# Patient Record
Sex: Male | Born: 1990 | Race: White | Hispanic: No | Marital: Single | Smoking: Former smoker
Health system: Southern US, Community
[De-identification: ages and names within clinical notes are randomized; demographics above are authoritative.]

## PROBLEM LIST (undated history)

## (undated) DIAGNOSIS — T7840XA Allergy, unspecified, initial encounter: Secondary | ICD-10-CM

## (undated) DIAGNOSIS — F32A Depression, unspecified: Secondary | ICD-10-CM

## (undated) DIAGNOSIS — K219 Gastro-esophageal reflux disease without esophagitis: Secondary | ICD-10-CM

## (undated) DIAGNOSIS — F419 Anxiety disorder, unspecified: Secondary | ICD-10-CM

## (undated) HISTORY — DX: Gastro-esophageal reflux disease without esophagitis: K21.9

## (undated) HISTORY — PX: TONSILLECTOMY: SUR1361

## (undated) HISTORY — DX: Depression, unspecified: F32.A

## (undated) HISTORY — DX: Anxiety disorder, unspecified: F41.9

## (undated) HISTORY — DX: Allergy, unspecified, initial encounter: T78.40XA

---
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
## 2017-11-20 ENCOUNTER — Emergency Department (HOSPITAL_BASED_OUTPATIENT_CLINIC_OR_DEPARTMENT_OTHER)
Admission: EM | Admit: 2017-11-20 | Discharge: 2017-11-20 | Disposition: A | Discharge status: Home / Self Care | Payer: Worker's Compensation | Attending: Emergency Medicine | Admitting: Emergency Medicine

## 2017-11-20 ENCOUNTER — Encounter (HOSPITAL_BASED_OUTPATIENT_CLINIC_OR_DEPARTMENT_OTHER): Payer: Self-pay | Admitting: *Deleted

## 2017-11-20 ENCOUNTER — Other Ambulatory Visit: Payer: Self-pay

## 2017-11-20 ENCOUNTER — Emergency Department (HOSPITAL_BASED_OUTPATIENT_CLINIC_OR_DEPARTMENT_OTHER): Payer: Worker's Compensation

## 2017-11-20 DIAGNOSIS — W208XXA Other cause of strike by thrown, projected or falling object, initial encounter: Secondary | ICD-10-CM | POA: Diagnosis not present

## 2017-11-20 DIAGNOSIS — Y9289 Other specified places as the place of occurrence of the external cause: Secondary | ICD-10-CM | POA: Insufficient documentation

## 2017-11-20 DIAGNOSIS — S99921A Unspecified injury of right foot, initial encounter: Secondary | ICD-10-CM | POA: Diagnosis present

## 2017-11-20 DIAGNOSIS — Y9389 Activity, other specified: Secondary | ICD-10-CM | POA: Diagnosis not present

## 2017-11-20 DIAGNOSIS — S97111A Crushing injury of right great toe, initial encounter: Secondary | ICD-10-CM

## 2017-11-20 DIAGNOSIS — Y990 Civilian activity done for income or pay: Secondary | ICD-10-CM | POA: Diagnosis not present

## 2017-11-20 MED ORDER — IBUPROFEN 800 MG PO TABS: 800.0000 mg | ORAL_TABLET | Freq: Three times a day (TID) | ORAL | 0 refills | Status: DC | PRN

## 2017-11-20 MED ORDER — IBUPROFEN 800 MG PO TABS
800.0000 mg | ORAL_TABLET | Freq: Once | ORAL | Status: AC
  Administered 2017-11-20: 800 mg via ORAL
  Filled 2017-11-20: qty 1

## 2017-11-20 NOTE — Discharge Instructions (Addendum)
You were evaluated in the emergency department for question of your right great toe.  Your x-ray did not show any obvious fracture.  You should apply ice to the area.  Gentle soap and water to keep it clean.  Postop shoe may help limit bending with ambulation.  Ibuprofen 3 times a day with food on your stomach.  Return if any concerns.

## 2017-11-20 NOTE — ED Triage Notes (Signed)
Pt was working and dropped a machine on his Big right toe.

## 2017-11-20 NOTE — ED Provider Notes (Signed)
[facility] EMERGENCY DEPARTMENT Provider Note   CSN: [id number] Arrival date & time: 11/20/17  1619     History   Chief Complaint Chief Complaint  Patient presents with  . Extremity Laceration    HPI Dennis Russo is a 27 y.o. male.  He is here with a work-related injury.  He said he dropped about 100 pound machine onto his right foot crushing his right great toe.  He is wearing sneakers at the time.  This occurred about an hour prior to arrival.  He rates the pain is severe and throbbing in his great toe.  There is been associated bleeding.  Denies other injury.  He is tried nothing for it.  The history is provided by the patient.  Foot Injury   The incident occurred less than 1 hour ago. The incident occurred at work. The injury mechanism was a direct blow. The pain is present in the right toes. The quality of the pain is described as throbbing. The pain is severe. The pain has been constant since onset. Associated symptoms include tingling. Pertinent negatives include no numbness, no inability to bear weight, no loss of motion and no loss of sensation. He reports no foreign bodies present. The symptoms are aggravated by bearing weight and palpation. He has tried nothing for the symptoms. The treatment provided no relief.    History reviewed. No pertinent past medical history.  There are no active problems to display for this patient.   Past Surgical History:  Procedure Laterality Date  . TONSILLECTOMY          Home Medications    Prior to Admission medications   Medication Sig Start Date End Date Taking? Authorizing Provider  ibuprofen (ADVIL,MOTRIN) 800 MG tablet Take 1 tablet (800 mg total) by mouth every 8 (eight) hours as needed. 11/20/17   Terrilee Files, MD    Family History History reviewed. No pertinent family history.  Social History Social History   Tobacco Use  . Smoking status: Never Smoker  . Smokeless tobacco: Never Used    Substance Use Topics  . Alcohol use: Yes    Alcohol/week: 2.0 standard drinks    Types: 2 Cans of beer per week  . Drug use: Never     Allergies   Penicillins   Review of Systems Review of Systems  Constitutional: Negative for fever.  HENT: Negative for sore throat.   Respiratory: Negative for shortness of breath.   Cardiovascular: Negative for chest pain.  Gastrointestinal: Negative for abdominal pain.  Genitourinary: Negative for dysuria.  Musculoskeletal: Negative for back pain.  Skin: Positive for wound. Negative for rash.  Neurological: Positive for tingling. Negative for numbness.     Physical Exam Updated Vital Signs BP (!) 145/93 (BP Location: Right Arm)   Pulse 77   Temp 98.4 F (36.9 C) (Oral)   Resp 18   Ht 6\' 3"  (1.905 m)   Wt 108.9 kg   SpO2 97%   BMI 30.00 kg/m   Physical Exam  Constitutional: He appears well-developed and well-nourished.  HENT:  Head: Normocephalic and atraumatic.  Eyes: Conjunctivae are normal.  Neck: Neck supple.  Pulmonary/Chest: Effort normal.  Musculoskeletal: He exhibits tenderness. He exhibits no deformity.  Diffuse tenderness over his right great toe from his MTP to his distal phalanx.  There is some blood from underneath the nail and a small subungual hematoma.  Nail is intact.  Sensation intact to light touch.  Other digits unaffected no tenderness  of the mid or proximal foot or ankle.  Neurological: He is alert. GCS eye subscore is 4. GCS verbal subscore is 5. GCS motor subscore is 6.  Skin: Skin is warm and dry.  Psychiatric: He has a normal mood and affect.  Nursing note and vitals reviewed.    ED Treatments / Results  Labs (all labs ordered are listed, but only abnormal results are displayed) Labs Reviewed - No data to display  EKG None  Radiology Dg Toe Great Right  Result Date: 11/20/2017 CLINICAL DATA:  27 y/o M; crush injury of great toe with blood oozing from the toenail. EXAM: RIGHT GREAT TOE  COMPARISON:  None. FINDINGS: There is no evidence of fracture or dislocation. There is no evidence of arthropathy or other focal bone abnormality. S IMPRESSION: No acute fracture or dislocation identified. Electronically Signed   By: Mitzi HansenLance  Furusawa-Stratton M.D.   On: 11/20/2017 17:14    Procedures Procedures (including critical care time)  Medications Ordered in ED Medications  ibuprofen (ADVIL,MOTRIN) tablet 800 mg (has no administration in time range)     Initial Impression / Assessment and Plan / ED Course  I have reviewed the triage vital signs and the nursing notes.  Pertinent labs & imaging results that were available during my care of the patient were reviewed by me and considered in my medical decision making (see chart for details).       Final Clinical Impressions(s) / ED Diagnoses   Final diagnoses:  Crushing injury of right great toe, initial encounter    ED Discharge Orders         Ordered    ibuprofen (ADVIL,MOTRIN) 800 MG tablet  Every 8 hours PRN     11/20/17 1727           Terrilee FilesButler, Trace Cederberg C, MD 11/21/17 1505

## 2019-09-27 IMAGING — DX DG TOE GREAT 2+V*R*
3 series · 3 of 3 positions shown · non-contrast
Comparison: None.

CLINICAL DATA: 27 y/o M; crush injury of great toe with blood
oozing from the toenail.

EXAM:
RIGHT GREAT TOE

[toe ap]
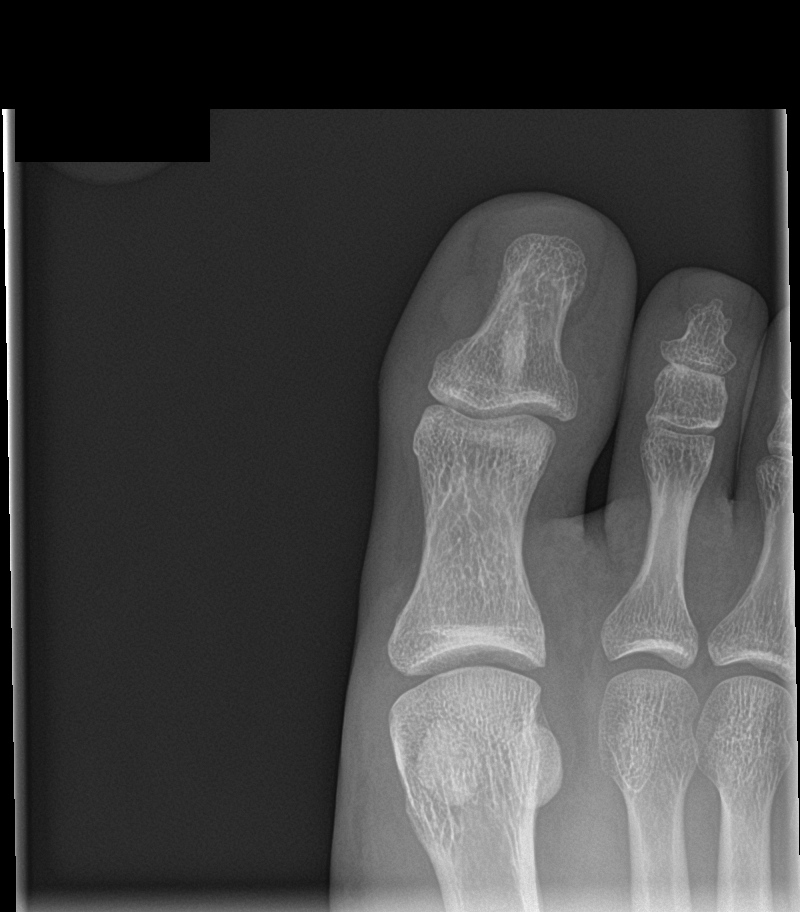

[toe obl]
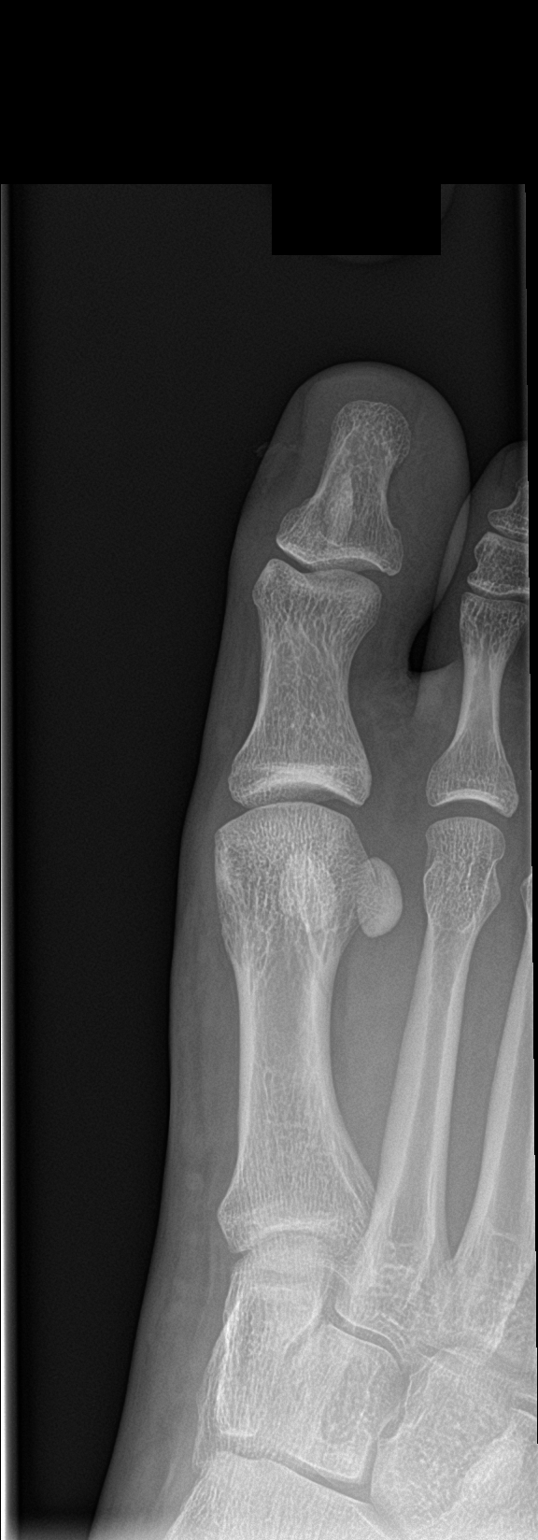

[toe lat]
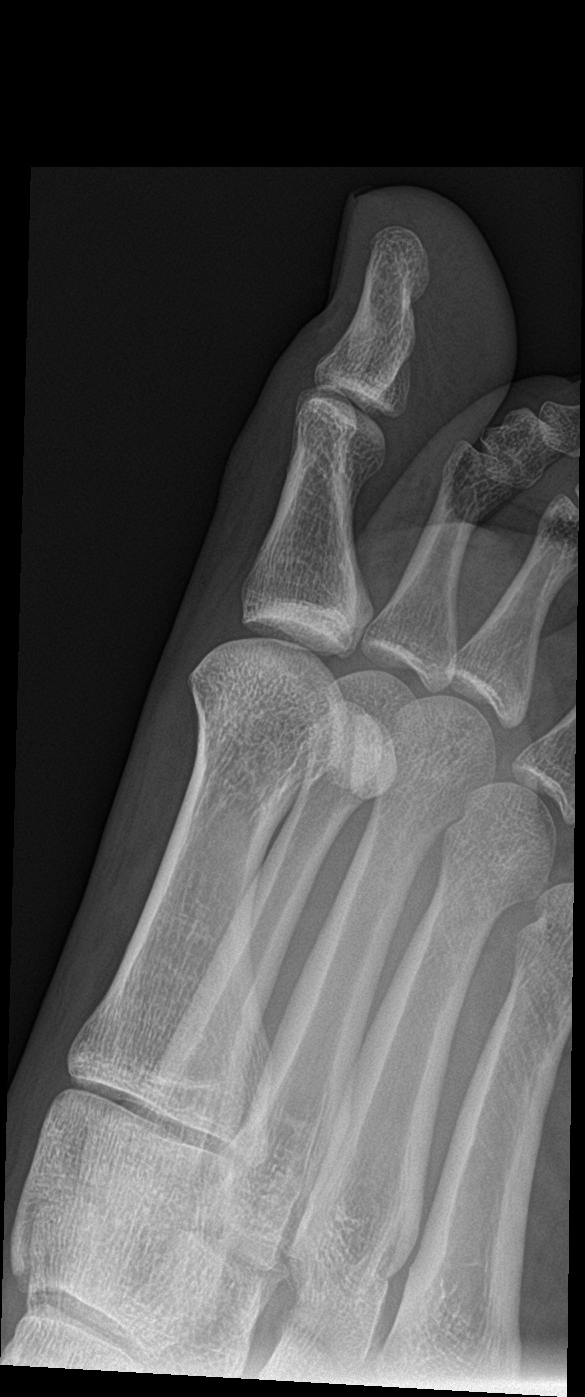

[3 of 3 positions shown; findings below may reference images not displayed]

FINDINGS: There is no evidence of fracture or dislocation. There is no
evidence of arthropathy or other focal bone abnormality. S
IMPRESSION: No acute fracture or dislocation identified.

## 2022-06-05 DIAGNOSIS — F331 Major depressive disorder, recurrent, moderate: Secondary | ICD-10-CM | POA: Diagnosis not present

## 2022-06-05 DIAGNOSIS — F3489 Other specified persistent mood disorders: Secondary | ICD-10-CM | POA: Diagnosis not present

## 2022-06-05 DIAGNOSIS — F411 Generalized anxiety disorder: Secondary | ICD-10-CM | POA: Diagnosis not present

## 2022-06-06 DIAGNOSIS — F331 Major depressive disorder, recurrent, moderate: Secondary | ICD-10-CM | POA: Diagnosis not present

## 2022-06-06 DIAGNOSIS — F411 Generalized anxiety disorder: Secondary | ICD-10-CM | POA: Diagnosis not present

## 2022-06-10 DIAGNOSIS — F411 Generalized anxiety disorder: Secondary | ICD-10-CM | POA: Diagnosis not present

## 2022-06-10 DIAGNOSIS — F3489 Other specified persistent mood disorders: Secondary | ICD-10-CM | POA: Diagnosis not present

## 2022-06-10 DIAGNOSIS — F331 Major depressive disorder, recurrent, moderate: Secondary | ICD-10-CM | POA: Diagnosis not present

## 2022-06-12 DIAGNOSIS — F411 Generalized anxiety disorder: Secondary | ICD-10-CM | POA: Diagnosis not present

## 2022-06-12 DIAGNOSIS — F331 Major depressive disorder, recurrent, moderate: Secondary | ICD-10-CM | POA: Diagnosis not present

## 2022-06-12 DIAGNOSIS — Z79899 Other long term (current) drug therapy: Secondary | ICD-10-CM | POA: Diagnosis not present

## 2022-06-12 DIAGNOSIS — F3489 Other specified persistent mood disorders: Secondary | ICD-10-CM | POA: Diagnosis not present

## 2022-06-21 DIAGNOSIS — F331 Major depressive disorder, recurrent, moderate: Secondary | ICD-10-CM | POA: Diagnosis not present

## 2022-06-21 DIAGNOSIS — F3489 Other specified persistent mood disorders: Secondary | ICD-10-CM | POA: Diagnosis not present

## 2022-06-21 DIAGNOSIS — F411 Generalized anxiety disorder: Secondary | ICD-10-CM | POA: Diagnosis not present

## 2022-06-25 DIAGNOSIS — Z79899 Other long term (current) drug therapy: Secondary | ICD-10-CM | POA: Diagnosis not present

## 2022-06-25 DIAGNOSIS — F411 Generalized anxiety disorder: Secondary | ICD-10-CM | POA: Diagnosis not present

## 2022-06-25 DIAGNOSIS — F3489 Other specified persistent mood disorders: Secondary | ICD-10-CM | POA: Diagnosis not present

## 2022-06-25 DIAGNOSIS — F331 Major depressive disorder, recurrent, moderate: Secondary | ICD-10-CM | POA: Diagnosis not present

## 2022-07-18 DIAGNOSIS — F331 Major depressive disorder, recurrent, moderate: Secondary | ICD-10-CM | POA: Diagnosis not present

## 2022-07-18 DIAGNOSIS — F411 Generalized anxiety disorder: Secondary | ICD-10-CM | POA: Diagnosis not present

## 2022-07-18 DIAGNOSIS — Z79899 Other long term (current) drug therapy: Secondary | ICD-10-CM | POA: Diagnosis not present

## 2022-07-26 DIAGNOSIS — F411 Generalized anxiety disorder: Secondary | ICD-10-CM | POA: Diagnosis not present

## 2022-07-26 DIAGNOSIS — F331 Major depressive disorder, recurrent, moderate: Secondary | ICD-10-CM | POA: Diagnosis not present

## 2022-08-22 DIAGNOSIS — F411 Generalized anxiety disorder: Secondary | ICD-10-CM | POA: Diagnosis not present

## 2022-08-22 DIAGNOSIS — F331 Major depressive disorder, recurrent, moderate: Secondary | ICD-10-CM | POA: Diagnosis not present

## 2022-08-26 DIAGNOSIS — Z79899 Other long term (current) drug therapy: Secondary | ICD-10-CM | POA: Diagnosis not present

## 2022-08-26 DIAGNOSIS — F411 Generalized anxiety disorder: Secondary | ICD-10-CM | POA: Diagnosis not present

## 2022-08-26 DIAGNOSIS — F331 Major depressive disorder, recurrent, moderate: Secondary | ICD-10-CM | POA: Diagnosis not present

## 2022-09-09 DIAGNOSIS — F411 Generalized anxiety disorder: Secondary | ICD-10-CM | POA: Diagnosis not present

## 2022-09-09 DIAGNOSIS — F331 Major depressive disorder, recurrent, moderate: Secondary | ICD-10-CM | POA: Diagnosis not present

## 2022-09-09 DIAGNOSIS — Z79899 Other long term (current) drug therapy: Secondary | ICD-10-CM | POA: Diagnosis not present

## 2022-09-22 DIAGNOSIS — Z79899 Other long term (current) drug therapy: Secondary | ICD-10-CM | POA: Diagnosis not present

## 2022-09-22 DIAGNOSIS — F411 Generalized anxiety disorder: Secondary | ICD-10-CM | POA: Diagnosis not present

## 2022-09-22 DIAGNOSIS — F331 Major depressive disorder, recurrent, moderate: Secondary | ICD-10-CM | POA: Diagnosis not present

## 2022-09-23 DIAGNOSIS — F331 Major depressive disorder, recurrent, moderate: Secondary | ICD-10-CM | POA: Diagnosis not present

## 2022-09-23 DIAGNOSIS — F411 Generalized anxiety disorder: Secondary | ICD-10-CM | POA: Diagnosis not present

## 2022-10-02 DIAGNOSIS — F411 Generalized anxiety disorder: Secondary | ICD-10-CM | POA: Diagnosis not present

## 2022-10-02 DIAGNOSIS — Z79899 Other long term (current) drug therapy: Secondary | ICD-10-CM | POA: Diagnosis not present

## 2022-10-02 DIAGNOSIS — F331 Major depressive disorder, recurrent, moderate: Secondary | ICD-10-CM | POA: Diagnosis not present

## 2022-10-22 DIAGNOSIS — F411 Generalized anxiety disorder: Secondary | ICD-10-CM | POA: Diagnosis not present

## 2022-10-22 DIAGNOSIS — F331 Major depressive disorder, recurrent, moderate: Secondary | ICD-10-CM | POA: Diagnosis not present

## 2022-10-24 DIAGNOSIS — F331 Major depressive disorder, recurrent, moderate: Secondary | ICD-10-CM | POA: Diagnosis not present

## 2022-10-24 DIAGNOSIS — Z79899 Other long term (current) drug therapy: Secondary | ICD-10-CM | POA: Diagnosis not present

## 2022-10-24 DIAGNOSIS — F411 Generalized anxiety disorder: Secondary | ICD-10-CM | POA: Diagnosis not present

## 2022-11-04 DIAGNOSIS — F331 Major depressive disorder, recurrent, moderate: Secondary | ICD-10-CM | POA: Diagnosis not present

## 2022-11-04 DIAGNOSIS — F411 Generalized anxiety disorder: Secondary | ICD-10-CM | POA: Diagnosis not present

## 2022-11-04 DIAGNOSIS — Z79899 Other long term (current) drug therapy: Secondary | ICD-10-CM | POA: Diagnosis not present

## 2022-11-21 DIAGNOSIS — F331 Major depressive disorder, recurrent, moderate: Secondary | ICD-10-CM | POA: Diagnosis not present

## 2022-11-21 DIAGNOSIS — F411 Generalized anxiety disorder: Secondary | ICD-10-CM | POA: Diagnosis not present

## 2022-12-19 DIAGNOSIS — F411 Generalized anxiety disorder: Secondary | ICD-10-CM | POA: Diagnosis not present

## 2022-12-19 DIAGNOSIS — F331 Major depressive disorder, recurrent, moderate: Secondary | ICD-10-CM | POA: Diagnosis not present

## 2022-12-23 DIAGNOSIS — F411 Generalized anxiety disorder: Secondary | ICD-10-CM | POA: Diagnosis not present

## 2022-12-23 DIAGNOSIS — Z79899 Other long term (current) drug therapy: Secondary | ICD-10-CM | POA: Diagnosis not present

## 2022-12-23 DIAGNOSIS — F331 Major depressive disorder, recurrent, moderate: Secondary | ICD-10-CM | POA: Diagnosis not present

## 2023-01-21 DIAGNOSIS — Z79899 Other long term (current) drug therapy: Secondary | ICD-10-CM | POA: Diagnosis not present

## 2023-01-21 DIAGNOSIS — F331 Major depressive disorder, recurrent, moderate: Secondary | ICD-10-CM | POA: Diagnosis not present

## 2023-01-21 DIAGNOSIS — F411 Generalized anxiety disorder: Secondary | ICD-10-CM | POA: Diagnosis not present

## 2023-01-23 DIAGNOSIS — F331 Major depressive disorder, recurrent, moderate: Secondary | ICD-10-CM | POA: Diagnosis not present

## 2023-01-23 DIAGNOSIS — F411 Generalized anxiety disorder: Secondary | ICD-10-CM | POA: Diagnosis not present

## 2023-02-27 DIAGNOSIS — F411 Generalized anxiety disorder: Secondary | ICD-10-CM | POA: Diagnosis not present

## 2023-02-27 DIAGNOSIS — F331 Major depressive disorder, recurrent, moderate: Secondary | ICD-10-CM | POA: Diagnosis not present

## 2023-03-27 DIAGNOSIS — F331 Major depressive disorder, recurrent, moderate: Secondary | ICD-10-CM | POA: Diagnosis not present

## 2023-03-27 DIAGNOSIS — F411 Generalized anxiety disorder: Secondary | ICD-10-CM | POA: Diagnosis not present

## 2023-04-02 DIAGNOSIS — F331 Major depressive disorder, recurrent, moderate: Secondary | ICD-10-CM | POA: Diagnosis not present

## 2023-04-02 DIAGNOSIS — Z79899 Other long term (current) drug therapy: Secondary | ICD-10-CM | POA: Diagnosis not present

## 2023-04-02 DIAGNOSIS — F411 Generalized anxiety disorder: Secondary | ICD-10-CM | POA: Diagnosis not present

## 2023-05-21 DIAGNOSIS — F331 Major depressive disorder, recurrent, moderate: Secondary | ICD-10-CM | POA: Diagnosis not present

## 2023-05-21 DIAGNOSIS — F411 Generalized anxiety disorder: Secondary | ICD-10-CM | POA: Diagnosis not present

## 2023-06-11 DIAGNOSIS — F331 Major depressive disorder, recurrent, moderate: Secondary | ICD-10-CM | POA: Diagnosis not present

## 2023-06-11 DIAGNOSIS — F411 Generalized anxiety disorder: Secondary | ICD-10-CM | POA: Diagnosis not present

## 2023-06-11 DIAGNOSIS — Z79899 Other long term (current) drug therapy: Secondary | ICD-10-CM | POA: Diagnosis not present

## 2023-06-19 DIAGNOSIS — F411 Generalized anxiety disorder: Secondary | ICD-10-CM | POA: Diagnosis not present

## 2023-06-19 DIAGNOSIS — F331 Major depressive disorder, recurrent, moderate: Secondary | ICD-10-CM | POA: Diagnosis not present

## 2023-06-29 DIAGNOSIS — F331 Major depressive disorder, recurrent, moderate: Secondary | ICD-10-CM | POA: Diagnosis not present

## 2023-06-29 DIAGNOSIS — F411 Generalized anxiety disorder: Secondary | ICD-10-CM | POA: Diagnosis not present

## 2023-06-29 DIAGNOSIS — Z79899 Other long term (current) drug therapy: Secondary | ICD-10-CM | POA: Diagnosis not present

## 2023-07-17 DIAGNOSIS — F331 Major depressive disorder, recurrent, moderate: Secondary | ICD-10-CM | POA: Diagnosis not present

## 2023-07-17 DIAGNOSIS — F411 Generalized anxiety disorder: Secondary | ICD-10-CM | POA: Diagnosis not present

## 2023-08-25 DIAGNOSIS — F411 Generalized anxiety disorder: Secondary | ICD-10-CM | POA: Diagnosis not present

## 2023-08-25 DIAGNOSIS — F331 Major depressive disorder, recurrent, moderate: Secondary | ICD-10-CM | POA: Diagnosis not present

## 2023-08-25 DIAGNOSIS — Z79899 Other long term (current) drug therapy: Secondary | ICD-10-CM | POA: Diagnosis not present

## 2023-08-28 DIAGNOSIS — F331 Major depressive disorder, recurrent, moderate: Secondary | ICD-10-CM | POA: Diagnosis not present

## 2023-08-28 DIAGNOSIS — F411 Generalized anxiety disorder: Secondary | ICD-10-CM | POA: Diagnosis not present

## 2023-10-20 ENCOUNTER — Ambulatory Visit: Admitting: Student in an Organized Health Care Education/Training Program

## 2023-10-20 ENCOUNTER — Encounter: Payer: Self-pay | Admitting: Student in an Organized Health Care Education/Training Program

## 2023-10-20 VITALS — BP 114/78 | HR 75 | Ht 74.0 in | Wt 317.8 lb

## 2023-10-20 DIAGNOSIS — Z800 Family history of malignant neoplasm of digestive organs: Secondary | ICD-10-CM

## 2023-10-20 DIAGNOSIS — Z131 Encounter for screening for diabetes mellitus: Secondary | ICD-10-CM | POA: Diagnosis not present

## 2023-10-20 DIAGNOSIS — Z0000 Encounter for general adult medical examination without abnormal findings: Secondary | ICD-10-CM | POA: Diagnosis not present

## 2023-10-20 DIAGNOSIS — Z1159 Encounter for screening for other viral diseases: Secondary | ICD-10-CM | POA: Diagnosis not present

## 2023-10-20 DIAGNOSIS — Z114 Encounter for screening for human immunodeficiency virus [HIV]: Secondary | ICD-10-CM | POA: Diagnosis not present

## 2023-10-20 DIAGNOSIS — E66813 Obesity, class 3: Secondary | ICD-10-CM

## 2023-10-20 DIAGNOSIS — Z23 Encounter for immunization: Secondary | ICD-10-CM | POA: Diagnosis not present

## 2023-10-20 DIAGNOSIS — F39 Unspecified mood [affective] disorder: Secondary | ICD-10-CM

## 2023-10-20 DIAGNOSIS — Z13220 Encounter for screening for lipoid disorders: Secondary | ICD-10-CM | POA: Diagnosis not present

## 2023-10-20 DIAGNOSIS — K219 Gastro-esophageal reflux disease without esophagitis: Secondary | ICD-10-CM

## 2023-10-20 DIAGNOSIS — L720 Epidermal cyst: Secondary | ICD-10-CM | POA: Insufficient documentation

## 2023-10-20 MED ORDER — BUPROPION HCL ER (SR) 100 MG PO TB12: 100.0000 mg | ORAL_TABLET | Freq: Every day | ORAL | Status: AC

## 2023-10-20 NOTE — Progress Notes (Addendum)
 Complete physical exam  Patient: Dennis Russo   DOB: June 22, 1990   33 y.o. Male  MRN: [id number]  Subjective:    Chief Complaint  Patient presents with   Annual Exam   Establish Care   Cyst    Right shoulder. Has been there for 10 years. Just wants it looked at.     Dennis Russo is a 33 y.o. male who presents today for a complete physical exam. He reports consuming a general diet. The patient does not participate in regular exercise at present. He generally feels well. He reports sleeping well. He does have additional problems to discuss today.   Discussed the use of AI scribe software for clinical note transcription with the patient, who gave verbal consent to proceed.  History of Present Illness Dennis Russo is a 33 year old male who presents for a new patient visit and weight management.  He acknowledges being overweight and wants to manage his weight. Earlier in the year, he was actively managing his weight but faced setbacks due to family stressors, including the passing of his grandfather and his mother's cancer diagnosis. Initially, he weighed around 310-312 pounds, reduced to 290 pounds, but has since increased to 317 pounds. He attributes this to stress eating and a lack of exercise. Previously, he followed a keto diet and reduced portion sizes and soda intake, which helped him lose weight. Currently, he estimates his daily caloric intake to be around 2500-3000 calories, whereas he aims for 1800-2000 calories.  He experiences occasional lightheadedness at work, which is infrequent and not concerning. No significant breathing problems or chest pain, although he notes difficulty catching his breath after laughing hard. He does not exercise regularly, citing laziness as a barrier, but plans to create an exercise room when he buys a house.  He has a cyst on his right shoulder, present for about a decade, which does not cause pain or significant discomfort. He noticed a  small dot on it recently, possibly from a cat scratch. A previous primary care doctor in [place]  told him it was a cyst and that he did not need to worry about it unless it changed.  He has a history of depression and anxiety, with depression being more prominent. He started seeing a psychiatrist and therapist last year and is currently on Lexapro 30 mg daily and bupropion 100 mg once daily in the morning. He also uses trazodone as needed for sleep issues, particularly on Sundays when he struggles to fall asleep. He describes his depression as a 'blanket or a weight' and his anxiety as situational, such as ensuring punctuality for appointments.  He has a family history of colon cancer; his father died in his early forties from the disease. Due to this, he began colonoscopy screenings early and had his last colonoscopy and endoscopy five years ago. He also has GERD, which he discovered after experiencing throat irritation and acid reflux. He does not currently take medication for GERD as it does not frequently flare up.  He reports no significant issues with alcohol, tobacco, or drug use. He drinks socially, infrequently, and last used tobacco in college. He experimented with drugs in college but has not used marijuana since 2021 or other substances since then.  He wears glasses for vision correction and occasionally experiences earwax buildup, which he manages with ear cleaners. He snores occasionally but generally feels rested after sleep, although he is not a morning person. He typically sleeps from 10 or 11  PM to 7 AM.   Most recent fall risk assessment:    10/20/2023    2:44 PM  Fall Risk   Falls in the past year? 0  Number falls in past yr: 0  Injury with Fall? 0  Risk for fall due to : No Fall Risks  Follow up Falls evaluation completed     Most recent depression screenings:    10/20/2023    2:44 PM  PHQ 2/9 Scores  PHQ - 2 Score 3  PHQ- 9 Score 9    Past Medical History:   Diagnosis Date   Allergy 96698006   Penicillin when i was a baby gave me a rash   Anxiety    Ive had anxiety diagnosed since elementary school   Depression    Ive had clinical depression diagnosed since elementary school   GERD (gastroesophageal reflux disease)    I suffer from gerd noted by an endoscopy in 2020      Patient Care Team: Jerrell Cleatus Ned, MD as PCP - General (Internal Medicine)   Outpatient Medications Prior to Visit  Medication Sig   escitalopram (LEXAPRO) 20 MG tablet Take 30 mg by mouth daily.   traZODone (DESYREL) 50 MG tablet Take 50 mg by mouth at bedtime. (Patient taking differently: Take 50 mg by mouth at bedtime as needed for sleep.)   [DISCONTINUED] buPROPion ER (WELLBUTRIN SR) 100 MG 12 hr tablet Take 100 mg by mouth 2 (two) times daily.   [DISCONTINUED] ibuprofen  (ADVIL ,MOTRIN ) 800 MG tablet Take 1 tablet (800 mg total) by mouth every 8 (eight) hours as needed. (Patient not taking: Reported on 10/20/2023)   No facility-administered medications prior to visit.       Objective:     BP 114/78 (BP Location: Right Arm, Patient Position: Sitting, Cuff Size: Large)   Pulse 75   Ht 6' 2 (1.88 m)   Wt (!) 317 lb 12.8 oz (144.2 kg)   SpO2 99%   BMI 40.80 kg/m   Physical Exam   Gen: Well-appearing man Eyes: Normal Ears: Normal tympanic membranes, minimal wax Neck: Increased circumference, normal thyroid, no nodules or adenopathy Heart: Regular, no murmur Lungs: Unlabored, clear throughout Abd: Soft, obese, striae present, nontender Ext: Warm, no edema, normal joints Skin: 4 cm inclusion cyst with a small erosion on the right posterior shoulder, nonerythematous, nondraining, fluctuant but nontender Neuro: Alert, conversational, full strength upper and lower extremities, normal get up and go, normal gait and balance Psych: Appropriate mood and affect, not anxious or depressed appearing      Assessment & Plan:    Routine Health Maintenance  and Physical Exam  Immunization History  Administered Date(s) Administered   DTaP 06/09/1991, 08/05/1991, 11/02/1991, 11/02/1992, 08/04/1996   HIB (PRP-OMP) 06/09/1991, 08/05/1991, 11/02/1991, 11/02/1992   Hepatitis B, PED/ADOLESCENT 18-Sep-1990, 05/12/1991, 02/10/1992   IPV 06/09/1991, 08/05/1991, 11/02/1992, 08/04/1996   Influenza, Seasonal, Injecte, Preservative Fre 10/20/2023   Janssen (J&J) SARS-COV-2 Vaccination 04/16/2019   MMR 06/28/1992, 08/04/1996   MenQuadfi_Meningococcal Groups ACYW Conjugate 07/10/2009   Td 08/20/2004   Tdap 10/20/2023   Unspecified SARS-COV-2 Vaccination 12/10/2019    Health Maintenance  Topic Date Due   HPV VACCINES (1 - 3-dose SCDM series) Never done   COVID-19 Vaccine (3 - 2025-26 season) 09/07/2023   DTaP/Tdap/Td (8 - Td or Tdap) 10/19/2033   Influenza Vaccine  Completed   Hepatitis B Vaccines 19-59 Average Risk  Completed   Hepatitis C Screening  Completed   HIV Screening  Completed  Pneumococcal Vaccine  Aged Out   Meningococcal B Vaccine  Aged Out    Discussed health benefits of physical activity, and encouraged him to engage in regular exercise appropriate for his age and condition.  Problem List Items Addressed This Visit       High   Mood disorder (Chronic)   His depression and anxiety are managed with Lexapro, bupropion, and trazodone. The current regimen is effective under his psychiatrist's management. Continue current psychiatric medications as directed by his psychiatrist.      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 Discussed medication-assisted weight loss options such as Zepbound and Mounjaro, which can achieve 15-20% weight loss over 6-12 months. Ordered blood work to assess diabetes and weight-related health issues.  I recommended continued lifestyle modifications, I recommended a 500 -calorie/day  deficits, for him would be around 2500 cal/day.      Relevant Orders   Comprehensive metabolic panel with GFR (Completed)   TSH (Completed)     Medium    GERD (gastroesophageal reflux disease) (Chronic)   His GERD is managed without medication. Discussed the potential use of omeprazole for flare-ups. Advise reporting any GERD flare-ups for possible omeprazole treatment.        Low   Health maintenance examination - Primary (Chronic)   Overall doing fairly well.  We talked about diet and exercise interventions for obesity and to lower the risk of future metabolic syndrome.  We worked on mood disorder.  No issues with substance use.  We talked about his family history of colon cancer which puts him at an above average risk of cancer, he has a good plan for screening colonoscopies every 5 years that is appropriate.  Vaccinations up-to-date, flu vaccine received today.      Family history of colon cancer in father (Chronic)   Father was diagnosed with colon cancer around the age of 58, ultimately passed away from this.  The patient started colon cancer screening already around the age of 41.  He is due for a 5-year follow-up colonoscopy, already established with GI.      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 It poses no cancer risk but is bothersome. Surgical removal is required. Refer to general surgery for evaluation of cyst removal.  Not currently infected, no need for antibiotics.  Not currently draining either.  Given its size, does seem reasonable that he wants this removed.      Relevant Orders   Ambulatory referral to General Surgery   Other Visit Diagnoses       Need for influenza vaccination       Relevant Orders   Flu vaccine trivalent PF, 6mos and older(Flulaval,Afluria,Fluarix,Fluzone) (Completed)     Screening for HIV (human immunodeficiency virus)       Relevant Orders   HIV  antibody (with reflex) (Completed)     Encounter for HCV screening test for low risk patient       Relevant Orders   Hepatitis C Antibody (Completed)     Screening for diabetes mellitus       Relevant Orders   Hemoglobin A1c (Completed)     Screening for lipid disorders       Relevant Orders   Lipid panel (Completed)  Need for diphtheria-tetanus-pertussis (Tdap) vaccine       Relevant Orders   Tdap vaccine greater than or equal to 7yo IM (Completed)      Return in about 1 year (around 10/19/2024).     Cleatus Debby Specking, MD

## 2023-10-20 NOTE — Assessment & Plan Note (Signed)
 He has a 4 cm epidermal inclusion cyst on his right shoulder, present for a decade. It poses no cancer risk but is bothersome. Surgical removal is required. Refer to general surgery for evaluation of cyst removal.  Not currently infected, no need for antibiotics.  Not currently draining either.  Given its size, does seem reasonable that he wants this removed.

## 2023-10-20 NOTE — Assessment & Plan Note (Signed)
 His GERD is managed without medication. Discussed the potential use of omeprazole for flare-ups. Advise reporting any GERD flare-ups for possible omeprazole treatment.

## 2023-10-20 NOTE — Assessment & Plan Note (Signed)
 Father was diagnosed with colon cancer around the age of 29, ultimately passed away from this.  The patient started colon cancer screening already around the age of 44.  He is due for a 5-year follow-up colonoscopy, already established with GI.

## 2023-10-20 NOTE — Assessment & Plan Note (Signed)
 His depression and anxiety are managed with Lexapro, bupropion, and trazodone. The current regimen is effective under his psychiatrist's management. Continue current psychiatric medications as directed by his psychiatrist.

## 2023-10-20 NOTE — Patient Instructions (Signed)
  VISIT SUMMARY: Today, you came in for a new patient visit and to discuss weight management. We talked about your recent weight changes, stress factors, and your current health concerns, including your cyst, depression, anxiety, and GERD. We also reviewed your general health and wellness.  YOUR PLAN: -OBESITY: Obesity means having an excessive amount of body fat, which can lead to various health issues. We discussed medication options like Zepbound and Mounjaro that can help you lose 15-20% of your weight over 6-12 months. We will also do blood work to check for diabetes and other weight-related health issues. Please check with your insurance about coverage for these medications.  -DEPRESSION AND ANXIETY: Depression and anxiety are mental health conditions that affect your mood and behavior. Your current medications (Lexapro, bupropion, and trazodone) are working well, so continue taking them as prescribed by your psychiatrist.  -EPIDERMAL INCLUSION CYST, RIGHT SHOULDER: An epidermal inclusion cyst is a non-cancerous lump under the skin. You have a 4 cm cyst on your right shoulder that has been there for a decade. We will refer you to general surgery to evaluate and possibly remove the cyst.  -GASTROESOPHAGEAL REFLUX DISEASE (GERD): GERD is a condition where stomach acid frequently flows back into the tube connecting your mouth and stomach, causing irritation. You manage your GERD without medication, but if you have flare-ups, let us  know so we can consider prescribing omeprazole.  -ADULT WELLNESS VISIT: During your wellness visit, we discussed your general health, diet, exercise, and mental health. We will perform a physical examination and order blood work to check your kidney function, liver enzymes, cholesterol, blood sugars, and thyroid function.  INSTRUCTIONS: Please follow up with the recommended blood work to assess your overall health and check for diabetes and other weight-related issues.  Also, check with your insurance about coverage for weight loss medications. We will refer you to general surgery for the evaluation and possible removal of the cyst on your right shoulder. Continue taking your psychiatric medications as prescribed by your psychiatrist. If you experience any GERD flare-ups, please report them so we can consider treatment options.

## 2023-10-20 NOTE — Assessment & Plan Note (Signed)
 He has a BMI of 40, indicating obesity. Discussed medication-assisted weight loss options such as Zepbound and Mounjaro, which can achieve 15-20% weight loss over 6-12 months. Ordered blood work to assess diabetes and weight-related health issues.  I recommended continued lifestyle modifications, I recommended a 500 -calorie/day deficits, for him would be around 2500 cal/day.

## 2023-10-21 LAB — COMPREHENSIVE METABOLIC PANEL WITH GFR
ALT: 32 U/L (ref 0–53)
AST: 20 U/L (ref 0–37)
Albumin: 4.8 g/dL (ref 3.5–5.2)
Alkaline Phosphatase: 99 U/L (ref 39–117)
BUN: 11 mg/dL (ref 6–23)
CO2: 24 meq/L (ref 19–32)
Calcium: 9.5 mg/dL (ref 8.4–10.5)
Chloride: 100 meq/L (ref 96–112)
Creatinine, Ser: 1.04 mg/dL (ref 0.40–1.50)
GFR: 94.98 mL/min (ref >60.00)
Glucose, Bld: 68 mg/dL — ABNORMAL LOW (ref 70–99)
Potassium: 3.6 meq/L (ref 3.5–5.1)
Sodium: 137 meq/L (ref 135–145)
Total Bilirubin: 0.5 mg/dL (ref 0.2–1.2)
Total Protein: 8.2 g/dL (ref 6.0–8.3)

## 2023-10-21 LAB — LIPID PANEL
Cholesterol: 221 mg/dL — ABNORMAL HIGH (ref 0–200)
HDL: 33.8 mg/dL — ABNORMAL LOW (ref >39.00)
LDL Cholesterol: 153 mg/dL — ABNORMAL HIGH (ref 0–99)
NonHDL: 187.23
Total CHOL/HDL Ratio: 7
Triglycerides: 170 mg/dL — ABNORMAL HIGH (ref 0.0–149.0)
VLDL: 34 mg/dL (ref 0.0–40.0)

## 2023-10-21 LAB — HEMOGLOBIN A1C: Hgb A1c MFr Bld: 5.3 % (ref 4.6–6.5)

## 2023-10-21 LAB — HEPATITIS C ANTIBODY: Hepatitis C Ab: NONREACTIVE

## 2023-10-21 LAB — HIV ANTIBODY (ROUTINE TESTING W REFLEX)
HIV 1&2 Ab, 4th Generation: NONREACTIVE
HIV FINAL INTERPRETATION: NEGATIVE

## 2023-10-21 LAB — TSH: TSH: 1.59 u[IU]/mL (ref 0.35–5.50)

## 2023-10-22 ENCOUNTER — Ambulatory Visit: Payer: Self-pay | Admitting: Student in an Organized Health Care Education/Training Program

## 2023-10-30 DIAGNOSIS — F411 Generalized anxiety disorder: Secondary | ICD-10-CM | POA: Diagnosis not present

## 2023-10-30 DIAGNOSIS — F331 Major depressive disorder, recurrent, moderate: Secondary | ICD-10-CM | POA: Diagnosis not present

## 2023-11-04 DIAGNOSIS — Z0000 Encounter for general adult medical examination without abnormal findings: Secondary | ICD-10-CM | POA: Insufficient documentation

## 2023-11-04 NOTE — Assessment & Plan Note (Addendum)
 Overall doing fairly well.  We talked about diet and exercise interventions for obesity and to lower the risk of future metabolic syndrome.  We worked on mood disorder.  No issues with substance use.  We talked about his family history of colon cancer which puts him at an above average risk of cancer, he has a good plan for screening colonoscopies every 5 years that is appropriate.  Vaccinations up-to-date, flu vaccine received today.

## 2023-11-23 DIAGNOSIS — Z79899 Other long term (current) drug therapy: Secondary | ICD-10-CM | POA: Diagnosis not present

## 2023-11-23 DIAGNOSIS — F331 Major depressive disorder, recurrent, moderate: Secondary | ICD-10-CM | POA: Diagnosis not present

## 2023-11-23 DIAGNOSIS — F411 Generalized anxiety disorder: Secondary | ICD-10-CM | POA: Diagnosis not present

## 2023-11-27 DIAGNOSIS — F331 Major depressive disorder, recurrent, moderate: Secondary | ICD-10-CM | POA: Diagnosis not present

## 2023-11-27 DIAGNOSIS — F411 Generalized anxiety disorder: Secondary | ICD-10-CM | POA: Diagnosis not present

## 2023-12-15 DIAGNOSIS — K621 Rectal polyp: Secondary | ICD-10-CM | POA: Diagnosis not present

## 2023-12-15 DIAGNOSIS — Z800 Family history of malignant neoplasm of digestive organs: Secondary | ICD-10-CM | POA: Diagnosis not present

## 2023-12-15 DIAGNOSIS — K649 Unspecified hemorrhoids: Secondary | ICD-10-CM | POA: Diagnosis not present

## 2023-12-15 DIAGNOSIS — K921 Melena: Secondary | ICD-10-CM | POA: Diagnosis not present

## 2023-12-15 LAB — HM COLONOSCOPY

## 2023-12-25 DIAGNOSIS — F331 Major depressive disorder, recurrent, moderate: Secondary | ICD-10-CM | POA: Diagnosis not present

## 2023-12-25 DIAGNOSIS — F411 Generalized anxiety disorder: Secondary | ICD-10-CM | POA: Diagnosis not present

## 2023-12-29 ENCOUNTER — Encounter: Payer: Self-pay | Admitting: Student in an Organized Health Care Education/Training Program

## 2023-12-29 DIAGNOSIS — K635 Polyp of colon: Secondary | ICD-10-CM | POA: Insufficient documentation

## 2024-01-19 ENCOUNTER — Encounter: Payer: Self-pay | Admitting: Student in an Organized Health Care Education/Training Program

## 2024-10-20 ENCOUNTER — Encounter: Admitting: Student in an Organized Health Care Education/Training Program
